# Patient Record
Sex: Male | Born: 1980 | Race: White | Hispanic: No | Marital: Married | Smoking: Never smoker
Health system: Southern US, Community
[De-identification: ages and names within clinical notes are randomized; demographics above are authoritative.]

## PROBLEM LIST (undated history)

## (undated) DIAGNOSIS — Z8619 Personal history of other infectious and parasitic diseases: Secondary | ICD-10-CM

## (undated) DIAGNOSIS — F329 Major depressive disorder, single episode, unspecified: Secondary | ICD-10-CM

## (undated) DIAGNOSIS — K921 Melena: Secondary | ICD-10-CM

## (undated) DIAGNOSIS — I10 Essential (primary) hypertension: Secondary | ICD-10-CM

## (undated) DIAGNOSIS — F32A Depression, unspecified: Secondary | ICD-10-CM

## (undated) HISTORY — DX: Depression, unspecified: F32.A

## (undated) HISTORY — DX: Essential (primary) hypertension: I10

## (undated) HISTORY — DX: Major depressive disorder, single episode, unspecified: F32.9

## (undated) HISTORY — DX: Personal history of other infectious and parasitic diseases: Z86.19

## (undated) HISTORY — PX: APPENDECTOMY: SHX54

## (undated) HISTORY — DX: Melena: K92.1

## (undated) HISTORY — PX: KNEE ARTHROSCOPY: SUR90

---

## 2011-12-07 ENCOUNTER — Encounter: Payer: Self-pay | Admitting: Internal Medicine

## 2011-12-07 ENCOUNTER — Ambulatory Visit (INDEPENDENT_AMBULATORY_CARE_PROVIDER_SITE_OTHER): Payer: BC Managed Care – PPO | Admitting: Internal Medicine

## 2011-12-07 VITALS — BP 102/70 | HR 74 | Temp 98.6°F | Resp 18 | Ht 72.5 in | Wt 222.0 lb

## 2011-12-07 DIAGNOSIS — Z0000 Encounter for general adult medical examination without abnormal findings: Secondary | ICD-10-CM

## 2011-12-07 NOTE — Progress Notes (Signed)
Subjective:    Patient ID: Carlos Hardy, male    DOB: September 18, 1980, 31 y.o.   MRN: [id number]  HPI  31 year old patient who is seen today to establish with our practice. He enjoys excellent health and has no concerns or complaints. Past history is fairly unremarkable does have remote history depression and headaches which have been stable. He has had some borderline high blood pressure readings in the past. He has had bilateral arthroscopic knee surgery. Review of systems exam is otherwise unremarkable did have a single episode of bright red rectal bleeding in the past which has not reoccurred. Social history born in [place] where his mother still resides has been degrees for area since 2006 and graduated [employer] with a doctorate in music and 2011.  No children Family history father died of complications of alcoholism at age 8 mother is 14 with a history of breast cancer. Father had a history of hypertension and depression one twin brother in good health. Paternal grandmother with ovarian cancer maternal grandfather history of diabetes    Review of Systems  Constitutional: Negative for fever, chills, activity change, appetite change and fatigue.  HENT: Negative for hearing loss, ear pain, congestion, rhinorrhea, sneezing, mouth sores, trouble swallowing, neck pain, neck stiffness, dental problem, voice change, sinus pressure and tinnitus.   Eyes: Negative for photophobia, pain, redness and visual disturbance.  Respiratory: Negative for apnea, cough, choking, chest tightness, shortness of breath and wheezing.   Cardiovascular: Negative for chest pain, palpitations and leg swelling.  Gastrointestinal: Negative for nausea, vomiting, abdominal pain, diarrhea, constipation, blood in stool, abdominal distention, anal bleeding and rectal pain.  Genitourinary: Negative for dysuria, urgency, frequency, hematuria, flank pain, decreased urine volume, discharge, penile swelling, scrotal swelling,  difficulty urinating, genital sores and testicular pain.  Musculoskeletal: Negative for myalgias, back pain, joint swelling, arthralgias and gait problem.  Skin: Negative for color change, rash and wound.  Neurological: Negative for dizziness, tremors, seizures, syncope, facial asymmetry, speech difficulty, weakness, light-headedness, numbness and headaches.  Hematological: Negative for adenopathy. Does not bruise/bleed easily.  Psychiatric/Behavioral: Negative for suicidal ideas, hallucinations, behavioral problems, confusion, disturbed wake/sleep cycle, self-injury, dysphoric mood, decreased concentration and agitation. The patient is not nervous/anxious.        Objective:   Physical Exam  Constitutional: He appears well-developed and well-nourished.  HENT:  Head: Normocephalic and atraumatic.  Right Ear: External ear normal.  Left Ear: External ear normal.  Nose: Nose normal.  Mouth/Throat: Oropharynx is clear and moist.  Eyes: Conjunctivae and EOM are normal. Pupils are equal, round, and reactive to light. No scleral icterus.  Neck: Normal range of motion. Neck supple. No JVD present. No thyromegaly present.  Cardiovascular: Regular rhythm, normal heart sounds and intact distal pulses.  Exam reveals no gallop and no friction rub.   No murmur heard. Pulmonary/Chest: Effort normal and breath sounds normal. He exhibits no tenderness.  Abdominal: Soft. Bowel sounds are normal. He exhibits no distension and no mass. There is no tenderness.  Genitourinary: Prostate normal and penis normal.  Musculoskeletal: Normal range of motion. He exhibits no edema and no tenderness.  Lymphadenopathy:    He has no cervical adenopathy.  Neurological: He is alert. He has normal reflexes. No cranial nerve deficit. Coordination normal.  Skin: Skin is warm and dry. No rash noted.  Psychiatric: He has a normal mood and affect. His behavior is normal.          Assessment & Plan:   Unremarkable  clinical exam. Good exercise  capacity. Presently in training for a marathon in October He states that he had a health exam a few years ago that was unremarkable that included a high HDL cholesterol. We'll attempt to obtain these lab records.  Return here when necessary

## 2011-12-07 NOTE — Patient Instructions (Signed)
It is important that you exercise regularly, at least 20 minutes 3 to 4 times per week.  If you develop chest pain or shortness of breath seek  medical attention.  Call or return to clinic prn if these symptoms worsen or fail to improve as anticipated.  

## 2012-04-05 ENCOUNTER — Encounter: Payer: Self-pay | Admitting: Family Medicine

## 2012-04-05 ENCOUNTER — Ambulatory Visit (INDEPENDENT_AMBULATORY_CARE_PROVIDER_SITE_OTHER): Payer: BC Managed Care – PPO | Admitting: Family Medicine

## 2012-04-05 VITALS — BP 110/80 | Temp 97.9°F | Wt 225.0 lb

## 2012-04-05 DIAGNOSIS — J45909 Unspecified asthma, uncomplicated: Secondary | ICD-10-CM

## 2012-04-05 MED ORDER — HYDROCODONE-HOMATROPINE 5-1.5 MG/5ML PO SYRP: ORAL_SOLUTION | ORAL | Status: DC

## 2012-04-05 MED ORDER — PREDNISONE 20 MG PO TABS: ORAL_TABLET | ORAL | Status: DC

## 2012-04-05 NOTE — Progress Notes (Signed)
  Subjective:    Patient ID: Carlos Hardy, male    DOB: [birth year], 31 y.o.   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 However the cough has persisted. He's had no fever chills or sputum production. He's had no previous history of lung disease specifically no asthma. Also he's a nonsmoker. His wife has been well.   Review of Systems    general and pulmonary review of systems otherwise negative Objective:   Physical Exam  Well-developed well-nourished male in no acute distress HEENT negative neck was supple no adenopathy lungs are clear no wheezing      Assessment & Plan:  Reactive airway disease plan prednisone burst and taper return when necessary

## 2012-04-05 NOTE — Patient Instructions (Signed)
Take the prednisone,,,,,,,,,, 2 tabs daily for 5 days or until you see a significant decrease in your cough then taper as outlined  Drink lots of water  Hydromet 1/2-1 teaspoon at bedtime when necessary for cough  Return when necessary

## 2012-07-10 ENCOUNTER — Ambulatory Visit (INDEPENDENT_AMBULATORY_CARE_PROVIDER_SITE_OTHER): Payer: BC Managed Care – PPO | Admitting: Internal Medicine

## 2012-07-10 ENCOUNTER — Encounter: Payer: Self-pay | Admitting: Internal Medicine

## 2012-07-10 VITALS — BP 122/84 | HR 80 | Temp 98.6°F | Resp 20 | Wt 236.0 lb

## 2012-07-10 DIAGNOSIS — K529 Noninfective gastroenteritis and colitis, unspecified: Secondary | ICD-10-CM

## 2012-07-10 DIAGNOSIS — K5289 Other specified noninfective gastroenteritis and colitis: Secondary | ICD-10-CM

## 2012-07-10 MED ORDER — PROMETHAZINE HCL 25 MG PO TABS: 25.0000 mg | ORAL_TABLET | Freq: Three times a day (TID) | ORAL | Status: DC | PRN

## 2012-07-10 NOTE — Progress Notes (Signed)
Subjective:    Patient ID: Carlos Hardy, male    DOB: 1981-04-18, 32 y.o.   MRN: [id number]  HPI 32 year old patient who presents with a 4 to five-day history of episodic crampy abdominal pain diarrhea nausea and vomiting. He has had fever early on illness which has resolved but still has some significant myalgias and fatigue.  He also complains of increasing situational stress and is requesting referral to a counselor  Past Medical History  Diagnosis Date  . Depression   . Hypertension     white coat  . History of chicken pox   . Blood in stool     History   Social History  . Marital Status: Married    Spouse Name: N/A    Number of Children: N/A  . Years of Education: N/A   Occupational History  . Not on file.   Social History Main Topics  . Smoking status: Former Games developer  . Smokeless tobacco: Never Used  . Alcohol Use: Yes  . Drug Use: No  . Sexually Active: Not on file   Other Topics Concern  . Not on file   Social History Narrative  . No narrative on file    Past Surgical History  Procedure Date  . Appendectomy   . Knee arthroscopy     Family History  Problem Relation Age of Onset  . Cancer Mother     breast ca  . Alcohol abuse Father   . Hypertension Father   . Mental illness Father   . Stroke Paternal Aunt   . Arthritis Maternal Grandmother   . Cancer Maternal Grandmother   . Diabetes Maternal Grandfather   . Arthritis Paternal Grandmother     No Known Allergies  Current Outpatient Prescriptions on File Prior to Visit  Medication Sig Dispense Refill  . Multiple Vitamin (MULTIVITAMIN) tablet Take 1 tablet by mouth daily.      . promethazine (PHENERGAN) 25 MG tablet Take 1 tablet (25 mg total) by mouth every 8 (eight) hours as needed for nausea.  20 tablet  0    BP 122/84  Pulse 80  Temp 98.6 F (37 C) (Oral)  Resp 20  Wt 236 lb (107.049 kg)  SpO2 98%       Review of Systems  Constitutional: Positive for fever and fatigue.  Negative for chills and appetite change.  HENT: Negative for hearing loss, ear pain, congestion, sore throat, trouble swallowing, neck stiffness, dental problem, voice change and tinnitus.   Eyes: Negative for pain, discharge and visual disturbance.  Respiratory: Negative for cough, chest tightness, wheezing and stridor.   Cardiovascular: Negative for chest pain, palpitations and leg swelling.  Gastrointestinal: Positive for nausea, vomiting and abdominal pain. Negative for diarrhea, constipation, blood in stool and abdominal distention.  Genitourinary: Negative for urgency, hematuria, flank pain, discharge, difficulty urinating and genital sores.  Musculoskeletal: Positive for myalgias. Negative for back pain, joint swelling, arthralgias and gait problem.  Skin: Negative for rash.  Neurological: Negative for dizziness, syncope, speech difficulty, weakness, numbness and headaches.  Hematological: Negative for adenopathy. Does not bruise/bleed easily.  Psychiatric/Behavioral: Negative for behavioral problems and dysphoric mood. The patient is nervous/anxious.        Objective:   Physical Exam  Constitutional: He is oriented to person, place, and time. He appears well-developed.  HENT:  Head: Normocephalic.  Right Ear: External ear normal.  Left Ear: External ear normal.  Eyes: Conjunctivae normal and EOM are normal.  Neck: Normal range of motion.  Cardiovascular: Normal rate and normal heart sounds.   Pulmonary/Chest: Breath sounds normal.  Abdominal: Bowel sounds are normal.  Musculoskeletal: Normal range of motion. He exhibits no edema and no tenderness.  Neurological: He is alert and oriented to person, place, and time.  Psychiatric: He has a normal mood and affect. His behavior is normal.          Assessment & Plan:    Viral gastroenteritis. Will treat symptomatically with Phenergan we'll advance diet slowly. Situational stress. We'll set up for behavioral health  referral  Return here when necessary

## 2012-07-10 NOTE — Patient Instructions (Signed)
Viral Gastroenteritis Viral gastroenteritis is also known as stomach flu. This condition affects the stomach and intestinal tract. It can cause sudden diarrhea and vomiting. The illness typically lasts 3 to 8 days. Most people develop an immune response that eventually gets rid of the virus. While this natural response develops, the virus can make you quite ill. CAUSES  Many different viruses can cause gastroenteritis, such as rotavirus or noroviruses. You can catch one of these viruses by consuming contaminated food or water. You may also catch a virus by sharing utensils or other personal items with an infected person or by touching a contaminated surface. SYMPTOMS  The most common symptoms are diarrhea and vomiting. These problems can cause a severe loss of body fluids (dehydration) and a body salt (electrolyte) imbalance. Other symptoms may include:  Fever.  Headache.  Fatigue.  Abdominal pain. DIAGNOSIS  Your caregiver can usually diagnose viral gastroenteritis based on your symptoms and a physical exam. A stool sample may also be taken to test for the presence of viruses or other infections. TREATMENT  This illness typically goes away on its own. Treatments are aimed at rehydration. The most serious cases of viral gastroenteritis involve vomiting so severely that you are not able to keep fluids down. In these cases, fluids must be given through an intravenous line (IV). HOME CARE INSTRUCTIONS   Drink enough fluids to keep your urine clear or pale yellow. Drink small amounts of fluids frequently and increase the amounts as tolerated.  Ask your caregiver for specific rehydration instructions.  Avoid:  Foods high in sugar.  Alcohol.  Carbonated drinks.  Tobacco.  Juice.  Caffeine drinks.  Extremely hot or cold fluids.  Fatty, greasy foods.  Too much intake of anything at one time.  Dairy products until 24 to 48 hours after diarrhea stops.  You may consume probiotics.  Probiotics are active cultures of beneficial bacteria. They may lessen the amount and number of diarrheal stools in adults. Probiotics can be found in yogurt with active cultures and in supplements.  Wash your hands well to avoid spreading the virus.  Only take over-the-counter or prescription medicines for pain, discomfort, or fever as directed by your caregiver. Do not give aspirin to children. Antidiarrheal medicines are not recommended.  Ask your caregiver if you should continue to take your regular prescribed and over-the-counter medicines.  Keep all follow-up appointments as directed by your caregiver. SEEK IMMEDIATE MEDICAL CARE IF:   You are unable to keep fluids down.  You do not urinate at least once every 6 to 8 hours.  You develop shortness of breath.  You notice blood in your stool or vomit. This may look like coffee grounds.  You have abdominal pain that increases or is concentrated in one small area (localized).  You have persistent vomiting or diarrhea.  You have a fever.  The patient is a child younger than 3 months, and he or she has a fever.  The patient is a child older than 3 months, and he or she has a fever and persistent symptoms.  The patient is a child older than 3 months, and he or she has a fever and symptoms suddenly get worse.  The patient is a baby, and he or she has no tears when crying. MAKE SURE YOU:   Understand these instructions.  Will watch your condition.  Will get help right away if you are not doing well or get worse. Document Released: 06/07/2005 Document Revised: 08/30/2011 Document Reviewed: 03/24/2011   ExitCare Patient Information 2013 [employer].  

## 2012-07-25 ENCOUNTER — Ambulatory Visit: Payer: BC Managed Care – PPO | Admitting: Licensed Clinical Social Worker

## 2012-08-01 ENCOUNTER — Encounter: Payer: BC Managed Care – PPO | Admitting: Family Medicine

## 2012-08-08 ENCOUNTER — Ambulatory Visit: Payer: BC Managed Care – PPO | Admitting: Licensed Clinical Social Worker

## 2012-08-17 ENCOUNTER — Ambulatory Visit (INDEPENDENT_AMBULATORY_CARE_PROVIDER_SITE_OTHER): Payer: BC Managed Care – PPO | Admitting: Licensed Clinical Social Worker

## 2012-08-29 ENCOUNTER — Ambulatory Visit (INDEPENDENT_AMBULATORY_CARE_PROVIDER_SITE_OTHER): Payer: BC Managed Care – PPO | Admitting: Licensed Clinical Social Worker

## 2012-08-29 DIAGNOSIS — F411 Generalized anxiety disorder: Secondary | ICD-10-CM

## 2012-09-19 ENCOUNTER — Ambulatory Visit (INDEPENDENT_AMBULATORY_CARE_PROVIDER_SITE_OTHER): Payer: BC Managed Care – PPO | Admitting: Licensed Clinical Social Worker

## 2012-09-19 DIAGNOSIS — F411 Generalized anxiety disorder: Secondary | ICD-10-CM

## 2012-10-24 ENCOUNTER — Ambulatory Visit (INDEPENDENT_AMBULATORY_CARE_PROVIDER_SITE_OTHER): Payer: BC Managed Care – PPO | Admitting: Licensed Clinical Social Worker

## 2012-10-24 DIAGNOSIS — F411 Generalized anxiety disorder: Secondary | ICD-10-CM

## 2012-11-28 ENCOUNTER — Ambulatory Visit (INDEPENDENT_AMBULATORY_CARE_PROVIDER_SITE_OTHER): Payer: BC Managed Care – PPO | Admitting: Licensed Clinical Social Worker

## 2012-11-28 DIAGNOSIS — F411 Generalized anxiety disorder: Secondary | ICD-10-CM

## 2012-12-07 ENCOUNTER — Telehealth: Payer: Self-pay | Admitting: Internal Medicine

## 2012-12-07 NOTE — Telephone Encounter (Signed)
Okay to test for blood type

## 2012-12-07 NOTE — Telephone Encounter (Signed)
PT is requesting an order to have his blood type tested. He states that his wives OBGYN is requesting that this be completed within 4 weeks. Please assist.

## 2012-12-08 NOTE — Telephone Encounter (Signed)
Spoke to pt told him we can have his blood type done here but he may get charged for it. Told him if you donate blood at the [facility] they will tell you your blood type for free. Pt verbalized understanding and stated he did contact [facility] and takes 6-8 weeks and he needs to know in 4 weeks due to wife is pregnant. Pt stated he will call insurance to see if covered and get back to [place] if wants to have drawn here. Told him okay.

## 2012-12-26 ENCOUNTER — Ambulatory Visit (INDEPENDENT_AMBULATORY_CARE_PROVIDER_SITE_OTHER): Payer: BC Managed Care – PPO | Admitting: Licensed Clinical Social Worker

## 2012-12-26 DIAGNOSIS — F411 Generalized anxiety disorder: Secondary | ICD-10-CM

## 2013-01-25 ENCOUNTER — Ambulatory Visit (INDEPENDENT_AMBULATORY_CARE_PROVIDER_SITE_OTHER): Payer: BC Managed Care – PPO | Admitting: Licensed Clinical Social Worker

## 2013-01-25 DIAGNOSIS — F411 Generalized anxiety disorder: Secondary | ICD-10-CM

## 2013-01-26 ENCOUNTER — Ambulatory Visit (INDEPENDENT_AMBULATORY_CARE_PROVIDER_SITE_OTHER): Payer: BC Managed Care – PPO | Admitting: *Deleted

## 2013-01-26 DIAGNOSIS — Z23 Encounter for immunization: Secondary | ICD-10-CM

## 2013-01-31 ENCOUNTER — Encounter: Payer: Self-pay | Admitting: Family Medicine

## 2013-01-31 ENCOUNTER — Ambulatory Visit (INDEPENDENT_AMBULATORY_CARE_PROVIDER_SITE_OTHER): Payer: BC Managed Care – PPO | Admitting: Family Medicine

## 2013-01-31 VITALS — BP 120/68 | HR 82 | Temp 98.9°F | Wt 229.0 lb

## 2013-01-31 DIAGNOSIS — R197 Diarrhea, unspecified: Secondary | ICD-10-CM

## 2013-01-31 MED ORDER — ONDANSETRON 8 MG PO TBDP: 8.0000 mg | ORAL_TABLET | Freq: Three times a day (TID) | ORAL | Status: DC | PRN

## 2013-01-31 NOTE — Patient Instructions (Addendum)
Diarrhea Diarrhea is frequent loose and watery bowel movements. It can cause you to feel weak and dehydrated. Dehydration can cause you to become tired and thirsty, have a dry mouth, and have decreased urination that often is dark yellow. Diarrhea is a sign of another problem, most often an infection that will not last long. In most cases, diarrhea typically lasts 2 3 days. However, it can last longer if it is a sign of something more serious. It is important to treat your diarrhea as directed by your caregive to lessen or prevent future episodes of diarrhea. CAUSES  Some common causes include:  Gastrointestinal infections caused by viruses, bacteria, or parasites.  Food poisoning or food allergies.  Certain medicines, such as antibiotics, chemotherapy, and laxatives.  Artificial sweeteners and fructose.  Digestive disorders. HOME CARE INSTRUCTIONS  Ensure adequate fluid intake (hydration): have 1 cup (8 oz) of fluid for each diarrhea episode. Avoid fluids that contain simple sugars or sports drinks, fruit juices, whole milk products, and sodas. Your urine should be clear or pale yellow if you are drinking enough fluids. Hydrate with an oral rehydration solution that you can purchase at pharmacies, retail stores, and online. You can prepare an oral rehydration solution at home by mixing the following ingredients together:    tsp table salt.   tsp baking soda.   tsp salt substitute containing potassium chloride.  1  tablespoons sugar.  1 L (34 oz) of water.  Certain foods and beverages may increase the speed at which food moves through the gastrointestinal (GI) tract. These foods and beverages should be avoided and include:  Caffeinated and alcoholic beverages.  High-fiber foods, such as raw fruits and vegetables, nuts, seeds, and whole grain breads and cereals.  Foods and beverages sweetened with sugar alcohols, such as xylitol, sorbitol, and mannitol.  Some foods may be well  tolerated and may help thicken stool including:  Starchy foods, such as rice, toast, pasta, low-sugar cereal, oatmeal, grits, baked potatoes, crackers, and bagels.  Bananas.  Applesauce.  Add probiotic-rich foods to help increase healthy bacteria in the GI tract, such as yogurt and fermented milk products.  Wash your hands well after each diarrhea episode.  Only take over-the-counter or prescription medicines as directed by your caregiver.  Take a warm bath to relieve any burning or pain from frequent diarrhea episodes. SEEK IMMEDIATE MEDICAL CARE IF:   You are unable to keep fluids down.  You have persistent vomiting.  You have blood in your stool, or your stools are black and tarry.  You do not urinate in 6 8 hours, or there is only a small amount of very dark urine.  You have abdominal pain that increases or localizes.  You have weakness, dizziness, confusion, or lightheadedness.  You have a severe headache.  Your diarrhea gets worse or does not get better.  You have a fever or persistent symptoms for more than 2 3 days.  You have a fever and your symptoms suddenly get worse. MAKE SURE YOU:   Understand these instructions.  Will watch your condition.  Will get help right away if you are not doing well or get worse. Document Released: 05/28/2002 Document Revised: 05/24/2012 Document Reviewed: 02/13/2012 Exit[employer] Patient Information 2014 [employer].  

## 2013-01-31 NOTE — Progress Notes (Signed)
  Subjective:    Patient ID: Carlos Hardy, male    DOB: 1980/09/18, 32 y.o.   MRN: [id number]  HPI Acute illness/visit Onset yesterday body aches, nausea, diarrhea Diarrhea every one hour-watery and non bloody No improvement with Pepto bismol.   ? Fever.  Has some chills.  No vomiting.  No recent travels.  No recent antibiotics.   His wife has not had similar symptoms  Past Medical History  Diagnosis Date  . Depression   . Hypertension     white coat  . History of chicken pox   . Blood in stool    Past Surgical History  Procedure Laterality Date  . Appendectomy    . Knee arthroscopy      reports that he has quit smoking. He has never used smokeless tobacco. He reports that  drinks alcohol. He reports that he does not use illicit drugs. family history includes Alcohol abuse in his father; Arthritis in his maternal grandmother and paternal grandmother; Cancer in his maternal grandmother and mother; Diabetes in his maternal grandfather; Hypertension in his father; Mental illness in his father; Stroke in his paternal aunt. No Known Allergies    Review of Systems  Constitutional: Positive for fever, chills and fatigue.  HENT: Negative for congestion.   Respiratory: Negative for cough and shortness of breath.   Gastrointestinal: Positive for nausea, abdominal pain and diarrhea. Negative for vomiting and blood in stool.  Neurological: Negative for dizziness.       Objective:   Physical Exam  Constitutional: He appears well-developed and well-nourished.  HENT:  Mouth/Throat: Oropharynx is clear and moist.  Neck: Neck supple.  Cardiovascular: Normal rate and regular rhythm.   Pulmonary/Chest: Effort normal and breath sounds normal. No respiratory distress. He has no wheezes. He has no rales.  Abdominal: Soft. Bowel sounds are normal. He exhibits no distension and no mass. There is no rebound and no guarding.  Minimal diffuse tenderness lower abdomen right and left side. Not  localizing. No peritoneal signs          Assessment & Plan:  Acute diarrhea illness. Suspect viral. Zofran 8 mg every 8 hours as needed for nausea. Try over-the-counter Imodium. Dietary factors discussed. Work note written for today. Touch base if symptoms worsen or persist

## 2013-03-06 ENCOUNTER — Ambulatory Visit: Payer: BC Managed Care – PPO | Admitting: Licensed Clinical Social Worker

## 2013-04-12 ENCOUNTER — Telehealth: Payer: Self-pay

## 2013-04-12 NOTE — Telephone Encounter (Signed)
Medication List and allergies: updated and reviewed  90 day supply/mail order: na Local prescriptions: [facility] [address]  Immunizations due: flu vaccine (will advise at visit)  A/P:   No changes to FH, SH Last CPE 11/2011--Dr Kwiatkowski-unremarkable  To Discuss with Provider: [facility]

## 2013-04-16 ENCOUNTER — Encounter: Payer: Self-pay | Admitting: Family Medicine

## 2013-04-16 ENCOUNTER — Ambulatory Visit (INDEPENDENT_AMBULATORY_CARE_PROVIDER_SITE_OTHER): Payer: BC Managed Care – PPO | Admitting: Family Medicine

## 2013-04-16 VITALS — BP 118/70 | HR 63 | Temp 98.1°F | Resp 16 | Ht 72.5 in | Wt 232.0 lb

## 2013-04-16 DIAGNOSIS — Z0000 Encounter for general adult medical examination without abnormal findings: Secondary | ICD-10-CM

## 2013-04-16 NOTE — Assessment & Plan Note (Signed)
Pt's PE WNL.  Check labs.  Anticipatory guidance provided.  

## 2013-04-16 NOTE — Progress Notes (Signed)
  Subjective:    Patient ID: Sivan Quast, male    DOB: 1981/04/14, 32 y.o.   MRN: [id number]  HPI CPE- no concerns.   Review of Systems Patient reports no vision/hearing changes, anorexia, fever ,adenopathy, persistant/recurrent hoarseness, swallowing issues, chest pain, palpitations, edema, persistant/recurrent cough, hemoptysis, dyspnea (rest,exertional, paroxysmal nocturnal), gastrointestinal  bleeding (melena, rectal bleeding), abdominal pain, excessive heart burn, GU symptoms (dysuria, hematuria, voiding/incontinence issues) syncope, focal weakness, memory loss, numbness & tingling, skin/hair/nail changes, depression, anxiety, abnormal bruising/bleeding, musculoskeletal symptoms/signs.     Objective:   Physical Exam BP 118/70  Pulse 63  Temp(Src) 98.1 F (36.7 C) (Oral)  Resp 16  Ht 6\' 5"  (1.956 m)  Wt 232 lb (105.235 kg)  BMI 27.51 kg/m2  SpO2 98%  General Appearance:    Alert, cooperative, no distress, appears stated age  Head:    Normocephalic, without obvious abnormality, atraumatic  Eyes:    PERRL, conjunctiva/corneas clear, EOM's intact, fundi    benign, both eyes       Ears:    Normal TM's and external ear canals, both ears  Nose:   Nares normal, septum midline, mucosa normal, no drainage   or sinus tenderness  Throat:   Lips, mucosa, and tongue normal; teeth and gums normal  Neck:   Supple, symmetrical, trachea midline, no adenopathy;       thyroid:  No enlargement/tenderness/nodules  Back:     Symmetric, no curvature, ROM normal, no CVA tenderness  Lungs:     Clear to auscultation bilaterally, respirations unlabored  Chest wall:    No tenderness or deformity  Heart:    Regular rate and rhythm, S1 and S2 normal, no murmur, rub   or gallop  Abdomen:     Soft, non-tender, bowel sounds active all four quadrants,    no masses, no organomegaly  Genitalia:    Normal male without lesion, discharge or tenderness  Rectal:      Extremities:   Extremities normal,  atraumatic, no cyanosis or edema  Pulses:   2+ and symmetric all extremities  Skin:   Skin color, texture, turgor normal, no rashes or lesions  Lymph nodes:   Cervical, supraclavicular, and axillary nodes normal  Neurologic:   CNII-XII intact. Normal strength, sensation and reflexes      throughout          Assessment & Plan:

## 2013-04-16 NOTE — Patient Instructions (Signed)
Follow up in 1 year or as needed We'll notify you of your lab results and make any changes if needed Keep up the good work!  You look great! Call with any questions or concerns Welcome!  We're glad to have you! 

## 2013-04-17 LAB — CBC WITH DIFFERENTIAL/PLATELET
Basophils Absolute: 0 10*3/uL (ref 0.0–0.1)
Basophils Relative: 0.3 % (ref 0.0–3.0)
Eosinophils Absolute: 0.2 10*3/uL (ref 0.0–0.7)
HCT: 42.5 % (ref 39.0–52.0)
Hemoglobin: 14.5 g/dL (ref 13.0–17.0)
Lymphocytes Relative: 27.6 % (ref 12.0–46.0)
Lymphs Abs: 2.8 10*3/uL (ref 0.7–4.0)
MCHC: 34.2 g/dL (ref 30.0–36.0)
MCV: 85.9 fl (ref 78.0–100.0)
Neutro Abs: 6.4 10*3/uL (ref 1.4–7.7)
Neutrophils Relative %: 62.4 % (ref 43.0–77.0)
RBC: 4.95 Mil/uL (ref 4.22–5.81)
RDW: 13.2 % (ref 11.5–14.6)

## 2013-04-17 LAB — BASIC METABOLIC PANEL
BUN: 13 mg/dL (ref 6–23)
CO2: 23 mEq/L (ref 19–32)
Calcium: 9.5 mg/dL (ref 8.4–10.5)
Chloride: 103 mEq/L (ref 96–112)
Creatinine, Ser: 0.8 mg/dL (ref 0.4–1.5)
GFR: 126.14 mL/min (ref >60.00)
Potassium: 3.9 mEq/L (ref 3.5–5.1)

## 2013-04-17 LAB — HEPATIC FUNCTION PANEL
ALT: 26 U/L (ref 0–53)
Alkaline Phosphatase: 43 U/L (ref 39–117)
Total Bilirubin: 0.8 mg/dL (ref 0.3–1.2)
Total Protein: 7.8 g/dL (ref 6.0–8.3)

## 2013-04-17 LAB — TSH: TSH: 0.29 u[IU]/mL — ABNORMAL LOW (ref 0.35–5.50)

## 2013-04-17 LAB — LIPID PANEL
Cholesterol: 196 mg/dL (ref 0–200)
Triglycerides: 57 mg/dL (ref 0.0–149.0)

## 2013-04-20 ENCOUNTER — Encounter: Payer: Self-pay | Admitting: Family Medicine

## 2013-11-16 ENCOUNTER — Ambulatory Visit (INDEPENDENT_AMBULATORY_CARE_PROVIDER_SITE_OTHER): Payer: BC Managed Care – PPO | Admitting: Physician Assistant

## 2013-11-16 ENCOUNTER — Encounter: Payer: Self-pay | Admitting: Physician Assistant

## 2013-11-16 VITALS — BP 124/88 | HR 76 | Temp 98.8°F | Resp 16 | Ht 72.5 in | Wt 222.5 lb

## 2013-11-16 DIAGNOSIS — H109 Unspecified conjunctivitis: Secondary | ICD-10-CM

## 2013-11-16 DIAGNOSIS — J329 Chronic sinusitis, unspecified: Secondary | ICD-10-CM

## 2013-11-16 MED ORDER — AMOXICILLIN 875 MG PO TABS: 875.0000 mg | ORAL_TABLET | Freq: Two times a day (BID) | ORAL | Status: DC

## 2013-11-16 MED ORDER — ERYTHROMYCIN 5 MG/GM OP OINT: 1.0000 "application " | TOPICAL_OINTMENT | Freq: Three times a day (TID) | OPHTHALMIC | Status: AC

## 2013-11-16 NOTE — Progress Notes (Signed)
Pre visit review using our clinic review tool, if applicable. No additional management support is needed unless otherwise documented below in the visit note/SLS  

## 2013-11-16 NOTE — Patient Instructions (Signed)
Please take antibiotic as directed.  Increase fluid intake.  Use Saline nasal spray.  Take a daily multivitamin. Use Delsym for cough.  Place a humidifier in the bedroom.  Please call or return clinic if symptoms are not improving.  For conjunctivitis -- apply antibiotic ointment as directed for 7 days.  Warm compresses to eyes.  Keep hands washed to avoid reinfection.  Sinusitis Sinusitis is redness, soreness, and swelling (inflammation) of the paranasal sinuses. Paranasal sinuses are air pockets within the bones of your face (beneath the eyes, the middle of the forehead, or above the eyes). In healthy paranasal sinuses, mucus is able to drain out, and air is able to circulate through them by way of your nose. However, when your paranasal sinuses are inflamed, mucus and air can become trapped. This can allow bacteria and other germs to grow and cause infection. Sinusitis can develop quickly and last only a short time (acute) or continue over a long period (chronic). Sinusitis that lasts for more than 12 weeks is considered chronic.  CAUSES  Causes of sinusitis include:  Allergies.  Structural abnormalities, such as displacement of the cartilage that separates your nostrils (deviated septum), which can decrease the air flow through your nose and sinuses and affect sinus drainage.  Functional abnormalities, such as when the small hairs (cilia) that line your sinuses and help remove mucus do not work properly or are not present. SYMPTOMS  Symptoms of acute and chronic sinusitis are the same. The primary symptoms are pain and pressure around the affected sinuses. Other symptoms include:  Upper toothache.  Earache.  Headache.  Bad breath.  Decreased sense of smell and taste.  A cough, which worsens when you are lying flat.  Fatigue.  Fever.  Thick drainage from your nose, which often is green and may contain pus (purulent).  Swelling and warmth over the affected sinuses. DIAGNOSIS    Your caregiver will perform a physical exam. During the exam, your caregiver may:  Look in your nose for signs of abnormal growths in your nostrils (nasal polyps).  Tap over the affected sinus to check for signs of infection.  View the inside of your sinuses (endoscopy) with a special imaging device with a light attached (endoscope), which is inserted into your sinuses. If your caregiver suspects that you have chronic sinusitis, one or more of the following tests may be recommended:  Allergy tests.  Nasal culture A sample of mucus is taken from your nose and sent to a lab and screened for bacteria.  Nasal cytology A sample of mucus is taken from your nose and examined by your caregiver to determine if your sinusitis is related to an allergy. TREATMENT  Most cases of acute sinusitis are related to a viral infection and will resolve on their own within 10 days. Sometimes medicines are prescribed to help relieve symptoms (pain medicine, decongestants, nasal steroid sprays, or saline sprays).  However, for sinusitis related to a bacterial infection, your caregiver will prescribe antibiotic medicines. These are medicines that will help kill the bacteria causing the infection.  Rarely, sinusitis is caused by a fungal infection. In theses cases, your caregiver will prescribe antifungal medicine. For some cases of chronic sinusitis, surgery is needed. Generally, these are cases in which sinusitis recurs more than 3 times per year, despite other treatments. HOME CARE INSTRUCTIONS   Drink plenty of water. Water helps thin the mucus so your sinuses can drain more easily.  Use a humidifier.  Inhale steam 3 to  4 times a day (for example, sit in the bathroom with the shower running).  Apply a warm, moist washcloth to your face 3 to 4 times a day, or as directed by your caregiver.  Use saline nasal sprays to help moisten and clean your sinuses.  Take over-the-counter or prescription medicines for  pain, discomfort, or fever only as directed by your caregiver. SEEK IMMEDIATE MEDICAL CARE IF:  You have increasing pain or severe headaches.  You have nausea, vomiting, or drowsiness.  You have swelling around your face.  You have vision problems.  You have a stiff neck.  You have difficulty breathing. MAKE SURE YOU:   Understand these instructions.  Will watch your condition.  Will get help right away if you are not doing well or get worse. Document Released: 06/07/2005 Document Revised: 08/30/2011 Document Reviewed: 06/22/2011 [facility] Patient Information 2014 [place], [place].

## 2013-11-18 DIAGNOSIS — H109 Unspecified conjunctivitis: Secondary | ICD-10-CM | POA: Insufficient documentation

## 2013-11-18 DIAGNOSIS — J329 Chronic sinusitis, unspecified: Secondary | ICD-10-CM | POA: Insufficient documentation

## 2013-11-18 NOTE — Assessment & Plan Note (Signed)
Rx erythromycin ophthalmic ointment. Applied to both eyes 3 times a day for 7 days. Warm compresses to eyes. Discussed hygiene measures to prevent the spread of infection and to prevent recurrence. Followup if symptoms are not improving.

## 2013-11-18 NOTE — Assessment & Plan Note (Signed)
Rx amoxicillin. Increase fluid intake. Rest. Saline nasal spray. Mucinex for cough/congestion. Place humidifier in bedroom.

## 2013-11-18 NOTE — Progress Notes (Signed)
Patient presents to clinic today c/o worsening sinus pressure, sinus pain, postnasal drip, and cough. Patient also endorses ear pain. Patient states cough was initially nonproductive, but over the past day has become productive of a green sputum. Patient also complains of waking up today with redness in both of his eyes. States his eyes for "crusted" upon awakening, and he had to clean them before being able to open them. Denies fever, chills, shortness of breath or wheezing. Denies recent travel. Notes his son has had conjunctivitis.  Past Medical History  Diagnosis Date  . Depression   . Hypertension     white coat  . History of chicken pox   . Blood in stool     Current Outpatient Prescriptions on File Prior to Visit  Medication Sig Dispense Refill  . Multiple Vitamin (MULTIVITAMIN) tablet Take 1 tablet by mouth daily.       No current facility-administered medications on file prior to visit.    No Known Allergies  Family History  Problem Relation Age of Onset  . Cancer Mother     breast ca  . Alcohol abuse Father   . Hypertension Father   . Mental illness Father   . Stroke Paternal Aunt   . Arthritis Maternal Grandmother   . Cancer Maternal Grandmother   . Diabetes Maternal Grandfather   . Arthritis Paternal Grandmother     History   Social History  . Marital Status: Married    Spouse Name: N/A    Number of Children: N/A  . Years of Education: N/A   Social History Main Topics  . Smoking status: Never Smoker   . Smokeless tobacco: Never Used  . Alcohol Use: Yes  . Drug Use: No  . Sexual Activity: Yes   Other Topics Concern  . None   Social History Narrative  . None   Review of Systems - See HPI.  All other ROS are negative.  BP 124/88  Pulse 76  Temp(Src) 98.8 F (37.1 C) (Oral)  Resp 16  Ht 6' 0.5" (1.842 m)  Wt 222 lb 8 oz (100.925 kg)  BMI 29.75 kg/m2  SpO2 98%  Physical Exam  Vitals reviewed. Constitutional: He is oriented to person, place,  and time and well-developed, well-nourished, and in no distress.  HENT:  Head: Normocephalic and atraumatic.  Right Ear: External ear normal.  Left Ear: External ear normal.  Nose: Nose normal.  Mouth/Throat: Oropharynx is clear and moist. No oropharyngeal exudate.  Tympanic membranes within normal limits bilaterally. Positive tenderness to percussion of sinuses noted.  Eyes: EOM are normal. Pupils are equal, round, and reactive to light. Lids are everted and swept, no foreign bodies found. Right eye exhibits discharge. Right eye exhibits no hordeolum. No foreign body present in the right eye. Left eye exhibits discharge. Left eye exhibits no hordeolum. No foreign body present in the left eye. Right conjunctiva is injected. Right conjunctiva has no hemorrhage. Left conjunctiva is injected. Left conjunctiva has no hemorrhage. No scleral icterus.  Cardiovascular: Normal rate, regular rhythm, normal heart sounds and intact distal pulses.   Pulmonary/Chest: Effort normal and breath sounds normal. No respiratory distress. He has no wheezes. He has no rales. He exhibits no tenderness.  Neurological: He is alert and oriented to person, place, and time.  Skin: Skin is warm and dry. No rash noted.  Psychiatric: Affect normal.   Assessment/Plan: Sinusitis Rx amoxicillin. Increase fluid intake. Rest. Saline nasal spray. Mucinex for cough/congestion. Place humidifier in bedroom.  Conjunctivitis Rx erythromycin ophthalmic ointment. Applied to both eyes 3 times a day for 7 days. Warm compresses to eyes. Discussed hygiene measures to prevent the spread of infection and to prevent recurrence. Followup if symptoms are not improving.

## 2013-12-03 ENCOUNTER — Encounter: Payer: Self-pay | Admitting: Family Medicine

## 2013-12-03 ENCOUNTER — Ambulatory Visit (INDEPENDENT_AMBULATORY_CARE_PROVIDER_SITE_OTHER): Payer: BC Managed Care – PPO | Admitting: Family Medicine

## 2013-12-03 VITALS — BP 136/93 | Ht 73.0 in | Wt 220.0 lb

## 2013-12-03 DIAGNOSIS — M25519 Pain in unspecified shoulder: Secondary | ICD-10-CM

## 2013-12-04 NOTE — Progress Notes (Signed)
Patient ID: Carlos Hardy, male   DOB: [birth year], 33 y.o.   MRN: [id number]  Carlos Hardy - 33 y.o. male MRN [id number]  Date of birth: [birth year]    SUBJECTIVE:     Right shoulder pain for several weeks. 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 Pain is mild--2-4 / 10 rarely takes anything for it. Right hand dominant.  ROS:     No numbness or tingling, no joint swelling, no fever.  PERTINENT  PMH / PSH FH / / SH:  Past Medical, Surgical, Social, and Family History Reviewed & Updated per EMR.  Pertinent Historical Findings include:  No prior hx rigt shoulder injury or surgery  OBJECTIVE: BP 136/93  Ht 6\' 1"  (1.854 m)  Wt 220 lb (99.791 kg)  BMI 29.03 kg/m2  Physical Exam:  Vital signs are reviewed. GEN: WD WN NAD Shoulder: B shoulders are symmetrical. Right shoulder: FROM and normal strength in all planes of rotator cuff. Some pain with supraspinatus tsting but no weakness. Neurovascularlyintact distally.  ASSESSMENT & PLAN:  See problem based charting & AVS for pt instructions. Rotator cuff syndrome (subacromial bursitis). No indication of RC cuff tear. We discussed options and will do HEP, f/u 4 weeks. Discussed CSI and he did notwant to pursue hat atthis time.

## 2013-12-26 ENCOUNTER — Ambulatory Visit (INDEPENDENT_AMBULATORY_CARE_PROVIDER_SITE_OTHER): Payer: BC Managed Care – PPO | Admitting: Sports Medicine

## 2013-12-26 ENCOUNTER — Encounter: Payer: Self-pay | Admitting: Sports Medicine

## 2013-12-26 VITALS — BP 131/89 | Ht 73.0 in | Wt 220.0 lb

## 2013-12-26 DIAGNOSIS — M25519 Pain in unspecified shoulder: Secondary | ICD-10-CM

## 2013-12-26 DIAGNOSIS — M25511 Pain in right shoulder: Secondary | ICD-10-CM

## 2013-12-26 MED ORDER — MELOXICAM 15 MG PO TABS: ORAL_TABLET | ORAL | Status: DC

## 2013-12-27 NOTE — Progress Notes (Signed)
   Subjective:    Patient ID: Carlos Hardy, male    DOB: Apr 08, 1981, 33 y.o.   MRN: [id number]  HPI chief complaint: Right shoulder pain  33 year old right-hand-dominant male comes in today complaining of several months of superior and anterior shoulder pain. No trauma that he can recall but a gradual onset of pain that is now constantly present. He describes it as aching in quality and it is worsened with certain shoulder motions such as reaching in the back seat of his automobile. No swelling. No neck pain. He does note some occasional numbness and tingling along the ulnar nerve distribution of his right arm beginning at the elbow and radiating into the hand but this is specific to certain movements that he does during his workouts. He denies deep-seated shoulder pain. He has not tried any medications. No prior shoulder surgery. He continues to work out despite his discomfort. He has not noticed any weakness. No pain in the left shoulder. He was seen in our office about a month ago and given a Jobe home exercise program which has not resulted in any improvement. In fact, he states the exercises make his shoulder pain worse. Past medical history review He takes no chronic medication No known drug allergies    Review of Systems    as above Objective:   Physical Exam Well-developed, well-nourished. No acute distress. Awake alert and oriented x3. Vital signs are reviewed  Right shoulder: Patient has full painless range of motion. There is tenderness to palpation directly over the a.c. joint with a positive cross arm adduction test. No deformity. No tenderness over the bicipital groove. Rotator cuff is 5/5. Negative empty can, negative Hawkins. Negative Neer's. Negative O'Briens. Negative clunk. No noticeable scapular dyskinesis or winging.  Right elbow: Full range of motion. No effusion. Mild Tinel's over the ulnar groove. No atrophy. Good grip strength. Good radial ulnar pulses.         Assessment & Plan:  Right shoulder pain likely secondary to a.c. joint inflammation Intermittent ulnar neuropathy, right arm  We will start with getting plain x-rays of his right shoulder. Patient is instructed not to do any sort of activity or exercise that loads the shoulder joint such as pushups or bench press. He can continue with all her other activity that does not hurt. I've also instructed him to avoid that specific exercise which causes neuropathic discomfort along the ulnar nerve distribution of his right arm. We will place him on Mobic 15 mg daily for 10 days and he'll followup in 3 weeks. I did discuss the possibility of a cortisone injection if symptoms warrant but the patient is not interested in that at this time.

## 2013-12-28 ENCOUNTER — Ambulatory Visit
Admission: RE | Admit: 2013-12-28 | Discharge: 2013-12-28 | Disposition: A | Discharge status: Home / Self Care | Payer: BC Managed Care – PPO | Source: Ambulatory Visit | Attending: Sports Medicine | Admitting: Sports Medicine

## 2013-12-28 DIAGNOSIS — M25511 Pain in right shoulder: Secondary | ICD-10-CM

## 2014-01-09 ENCOUNTER — Encounter: Payer: Self-pay | Admitting: Family Medicine

## 2014-01-21 ENCOUNTER — Encounter: Payer: Self-pay | Admitting: Sports Medicine

## 2014-01-21 ENCOUNTER — Ambulatory Visit (INDEPENDENT_AMBULATORY_CARE_PROVIDER_SITE_OTHER): Payer: BC Managed Care – PPO | Admitting: Sports Medicine

## 2014-01-21 VITALS — BP 158/96 | HR 77 | Ht 73.0 in | Wt 220.0 lb

## 2014-01-21 DIAGNOSIS — M25519 Pain in unspecified shoulder: Secondary | ICD-10-CM

## 2014-01-21 DIAGNOSIS — M25511 Pain in right shoulder: Secondary | ICD-10-CM

## 2014-01-21 NOTE — Progress Notes (Signed)
   Subjective:    Patient ID: Carlos Hardy, male    DOB: July 17, 1980, 33 y.o.   MRN: [id number]  HPI Patient comes in today for followup on his right shoulder pain. Overall, his pain has improved but he has not been doing any weight lifting or resistance training. Still gets discomfort with certain activities such as reaching in his back seat. Pain is a little more diffuse throughout the shoulder today than it was on his prior exam. He only meloxicam for 2 days. X-rays of his right shoulder done recently were unremarkable. Specifically, I did not see any significant a.c. joint arthropathy.    Review of Systems     Objective:   Physical Exam Well-developed, well-nourished. No acute distress. Awake alert and oriented x3. Vital signs are reviewed  Right shoulder: Full active and passive range of motion with a positive painful ARC. Slight tenderness over the a.c. joint but a negative cross arm adduction test today. Positive empty can, positive Hawkins. Rotator cuff strength is 5/5 but reproducible pain with resisted supraspinatus. Negative O'Brien. Neurovascularly intact distally.  X-rays are as above      Assessment & Plan:  Right shoulder pain likely secondary to rotator cuff tendinopathy/subacromial bursitis  I've asked the patient to resume his Mobic, this time taking it for a full 7 days. I also want him to work with physical therapy Oscar La(John O' Halloran). Patient will followup with me in 4 weeks. We discussed the merits of a subacromial cortisone injection if symptoms persist.

## 2014-02-18 ENCOUNTER — Ambulatory Visit: Payer: BC Managed Care – PPO | Admitting: Sports Medicine

## 2014-02-21 ENCOUNTER — Telehealth: Payer: Self-pay | Admitting: Physician Assistant

## 2014-02-21 ENCOUNTER — Ambulatory Visit (INDEPENDENT_AMBULATORY_CARE_PROVIDER_SITE_OTHER): Payer: BC Managed Care – PPO

## 2014-02-21 DIAGNOSIS — Z23 Encounter for immunization: Secondary | ICD-10-CM

## 2014-02-21 NOTE — Progress Notes (Signed)
Vaccine given in pt's left deltoid.  No signs of reaction prior to leaving clinic.  Hep B information sheet reviewed and given to patient.  Pt was encouraged to call with questions or concerns regarding vaccine.  Pt stated understanding and agreed.

## 2014-02-21 NOTE — Telephone Encounter (Signed)
Pt notified he needs a PPD (scheduled for next Tuesday) and Hep b series started today.

## 2014-02-21 NOTE — Telephone Encounter (Signed)
Caller name: Rondo  Relation to pt: self  Call back number: [phone]   Reason for call:  pt requesting immunization summary. i see it in the system was not sure if I can print. Pt would like to come p/u in office today

## 2014-02-21 NOTE — Telephone Encounter (Signed)
This patient does not belong to me. Will send this note to the CMA for the patient's PCP to print out [facility] data.

## 2014-02-22 ENCOUNTER — Ambulatory Visit: Payer: BC Managed Care – PPO

## 2014-02-26 ENCOUNTER — Ambulatory Visit (INDEPENDENT_AMBULATORY_CARE_PROVIDER_SITE_OTHER): Payer: BC Managed Care – PPO

## 2014-02-26 DIAGNOSIS — Z111 Encounter for screening for respiratory tuberculosis: Secondary | ICD-10-CM

## 2014-02-28 ENCOUNTER — Encounter: Payer: Self-pay | Admitting: General Practice

## 2014-02-28 ENCOUNTER — Ambulatory Visit (INDEPENDENT_AMBULATORY_CARE_PROVIDER_SITE_OTHER): Payer: BC Managed Care – PPO | Admitting: Family Medicine

## 2014-02-28 ENCOUNTER — Encounter: Payer: Self-pay | Admitting: Family Medicine

## 2014-02-28 VITALS — BP 130/82 | HR 87 | Temp 98.6°F | Resp 16 | Wt 222.2 lb

## 2014-02-28 DIAGNOSIS — J029 Acute pharyngitis, unspecified: Secondary | ICD-10-CM

## 2014-02-28 DIAGNOSIS — J020 Streptococcal pharyngitis: Secondary | ICD-10-CM | POA: Insufficient documentation

## 2014-02-28 DIAGNOSIS — R6889 Other general symptoms and signs: Secondary | ICD-10-CM

## 2014-02-28 LAB — POCT RAPID STREP A (OFFICE): Rapid Strep A Screen: POSITIVE — AB

## 2014-02-28 LAB — POCT INFLUENZA A/B
INFLUENZA A, POC: NEGATIVE
Influenza B, POC: NEGATIVE

## 2014-02-28 LAB — TB SKIN TEST
INDURATION: NEGATIVE mm
TB Skin Test: NEGATIVE

## 2014-02-28 MED ORDER — AMOXICILLIN 875 MG PO TABS: 875.0000 mg | ORAL_TABLET | Freq: Two times a day (BID) | ORAL | Status: AC

## 2014-02-28 NOTE — Assessment & Plan Note (Signed)
New.  Pt's sxs and PE consistent w/ strep.  Negative for flu.  Start abx.  Work note provided.  Reviewed supportive care and red flags that should prompt return.  Pt expressed understanding and is in agreement w/ plan.

## 2014-02-28 NOTE — Patient Instructions (Signed)
This is strep throat You should remain out of work until Monday 9/14 Start the Amoxicillin twice daily as directed Drink plenty of fluids Alternate tylenol/ibuprofen for pain/fever REST! Call with any questions or concerns Hang in there!!!

## 2014-02-28 NOTE — Progress Notes (Signed)
Pre visit review using our clinic review tool, if applicable. No additional management support is needed unless otherwise documented below in the visit note. 

## 2014-02-28 NOTE — Progress Notes (Signed)
   Subjective:    Patient ID: Carlos Hardy, male    DOB: Jan 13, 1981, 33 y.o.   MRN: [id number]  HPI URI- sxs started last night and worsened this AM.  Tm 102.8.  + sick contacts- wife and daughter.  No ear pain.  + HA, body aches, sore throat.  No N/V/D.  No coughing.  + facial pain.   Review of Systems For ROS see HPI     Objective:   Physical Exam  Vitals reviewed. Constitutional: He appears well-developed and well-nourished. No distress.  HENT:  Head: Normocephalic and atraumatic.  No TTP over sinuses TMs WNL bilaterally Throat erythematous w/ tonsillar exudates  Neck: Normal range of motion. Neck supple.  Cardiovascular: Normal rate, regular rhythm and normal heart sounds.   Pulmonary/Chest: Effort normal and breath sounds normal. No respiratory distress. He has no wheezes. He has no rales.  Lymphadenopathy:    He has cervical adenopathy.          Assessment & Plan:

## 2014-03-04 ENCOUNTER — Ambulatory Visit: Payer: BC Managed Care – PPO | Admitting: Sports Medicine

## 2014-04-03 ENCOUNTER — Encounter: Payer: Self-pay | Admitting: Sports Medicine

## 2014-04-03 ENCOUNTER — Ambulatory Visit (INDEPENDENT_AMBULATORY_CARE_PROVIDER_SITE_OTHER): Payer: BC Managed Care – PPO | Admitting: Sports Medicine

## 2014-04-03 VITALS — BP 135/101 | Ht 73.0 in | Wt 215.0 lb

## 2014-04-03 DIAGNOSIS — M25511 Pain in right shoulder: Secondary | ICD-10-CM | POA: Diagnosis not present

## 2014-04-04 NOTE — Progress Notes (Signed)
   Subjective:    Patient ID: Filomena JunglingChristian Erbes, male    DOB: Jan 01, 1981, 33 y.o.   MRN: [id number]  HPI Patient comes in today for followup on right shoulder pain. Still struggling with pain, particularly in the anterior aspect of his shoulder. His symptoms have now been present for about 10 months. He has tried physical therapy and prescription strength anti-inflammatories but despite this his symptoms persist. His pain is aching in quality. He does get some occasional catching and popping in the shoulder as well. Also some nighttime pain. He has not noticed any weakness. Denies any numbness or tingling. X-rays of his right shoulder done in July of this year were unremarkable.    Review of Systems     Objective:   Physical Exam Well-developed, well-nourished. No acute distress. Awake alert and oriented x3. Vital signs reviewed  Right shoulder: Full range of motion. No tenderness along the clavicle nor over the a.c. joint. Slight tenderness at the bicipital groove. Rotator cuff strength is 5/5. Negative empty can, negative Hawkins. Positive O'Brien's. Positive clunk. Negative apprehension. 1-2+ laxity with anterior to posterior humeral head translation. This is equal to the uninvolved left shoulder. Good grip strength. Neurovascularly intact distally.       Assessment & Plan:  Persistent right shoulder pain worrisome for labral tear  At this point, given his persistent symptoms and failure to improve with physical therapy and oral anti-inflammatories, I recommended proceeding with an MRI arthrogram specifically to rule out a labral tear which may need arthroscopic intervention. I will call the patient after I reviewed that study at which point we will delineate further treatment.

## 2014-04-17 ENCOUNTER — Other Ambulatory Visit: Payer: BC Managed Care – PPO

## 2014-06-17 ENCOUNTER — Ambulatory Visit
Admission: RE | Admit: 2014-06-17 | Discharge: 2014-06-17 | Disposition: A | Discharge status: Home / Self Care | Payer: BC Managed Care – PPO | Source: Ambulatory Visit | Attending: Sports Medicine | Admitting: Sports Medicine

## 2014-06-17 DIAGNOSIS — M25511 Pain in right shoulder: Secondary | ICD-10-CM

## 2014-06-17 MED ORDER — IOHEXOL 180 MG/ML  SOLN
20.0000 mL | Freq: Once | INTRAMUSCULAR | Status: AC | PRN
  Administered 2014-06-17: 20 mL via INTRA_ARTICULAR

## 2014-06-20 ENCOUNTER — Telehealth: Payer: Self-pay | Admitting: Sports Medicine

## 2014-06-20 NOTE — Telephone Encounter (Signed)
I spoke with the patient on the phone today after reviewing the MRI arthrogram of his right shoulder. No evidence of a labral tear. Biceps tendon is unremarkable. He has some mild rotator cuff tendinopathy/tendinosis but no obvious tearing. I've recommended that the patient return to the office for a subacromial cortisone injection. I think he would benefit from a return visit to physical therapy Carlos Hardy(Carlos Hardy). He will follow-up with me in the office in one to 2 weeks to discuss this further.

## 2014-07-08 ENCOUNTER — Ambulatory Visit (INDEPENDENT_AMBULATORY_CARE_PROVIDER_SITE_OTHER): Payer: BLUE CROSS/BLUE SHIELD | Admitting: Sports Medicine

## 2014-07-08 ENCOUNTER — Encounter: Payer: Self-pay | Admitting: Sports Medicine

## 2014-07-08 VITALS — BP 135/93 | HR 69 | Ht 73.0 in | Wt 217.0 lb

## 2014-07-08 DIAGNOSIS — M25511 Pain in right shoulder: Secondary | ICD-10-CM

## 2014-07-08 MED ORDER — METHYLPREDNISOLONE ACETATE 40 MG/ML IJ SUSP
40.0000 mg | Freq: Once | INTRAMUSCULAR | Status: AC
  Administered 2014-07-08: 40 mg via INTRA_ARTICULAR

## 2014-07-08 NOTE — Progress Notes (Signed)
Patient ID: Filomena JunglingChristian Sanmiguel, male   DOB: 01-07-1981, 34 y.o.   MRN: [id number] Patient comes in today at my request for a subacromial cortisone injection into his right shoulder. A recent MRI arthrogram of his right shoulder showed some mild rotator cuff tendinopathy/tendinosis but no tearing. No labral tear either. I will inject his subacromial space with cortisone today and then I have instructed him to return to physical therapy with Ellamae SiaJohn O'Halloran. I want the patient to call me in 6 weeks for a progress report. His symptoms are chronic enough (over 1 year) that if he continues to struggle despite today's injection and a return to physical therapy then I would consider referral to orthopedics to discuss a subacromial decompression and rotator cuff debridement. In the meantime he can continue with activity as tolerated using pain as his guide but I have cautioned him about repetitive overhead movements.  Consent obtained and verified. Time-out conducted. Noted no overlying erythema, induration, or other signs of local infection. Skin prepped in a sterile fashion. Topical analgesic spray: Ethyl chloride. Joint: right shoulder (subacromial) Needle: 25g 1.5 inch Completed without difficulty. Meds: 1cc (40mg ) depomedrol, 3cc 1% xylocaine  Advised to call if fevers/chills, erythema, induration, drainage, or persistent bleeding.

## 2015-08-09 IMAGING — CR DG SHOULDER 2+V*R*
3 series · 3 of 3 positions shown · non-contrast
Comparison: None.

CLINICAL DATA: Chronic RIGHT shoulder pain.  No injury.

EXAM:
RIGHT SHOULDER - 2+ VIEW

[w shoulder ap internal righ]
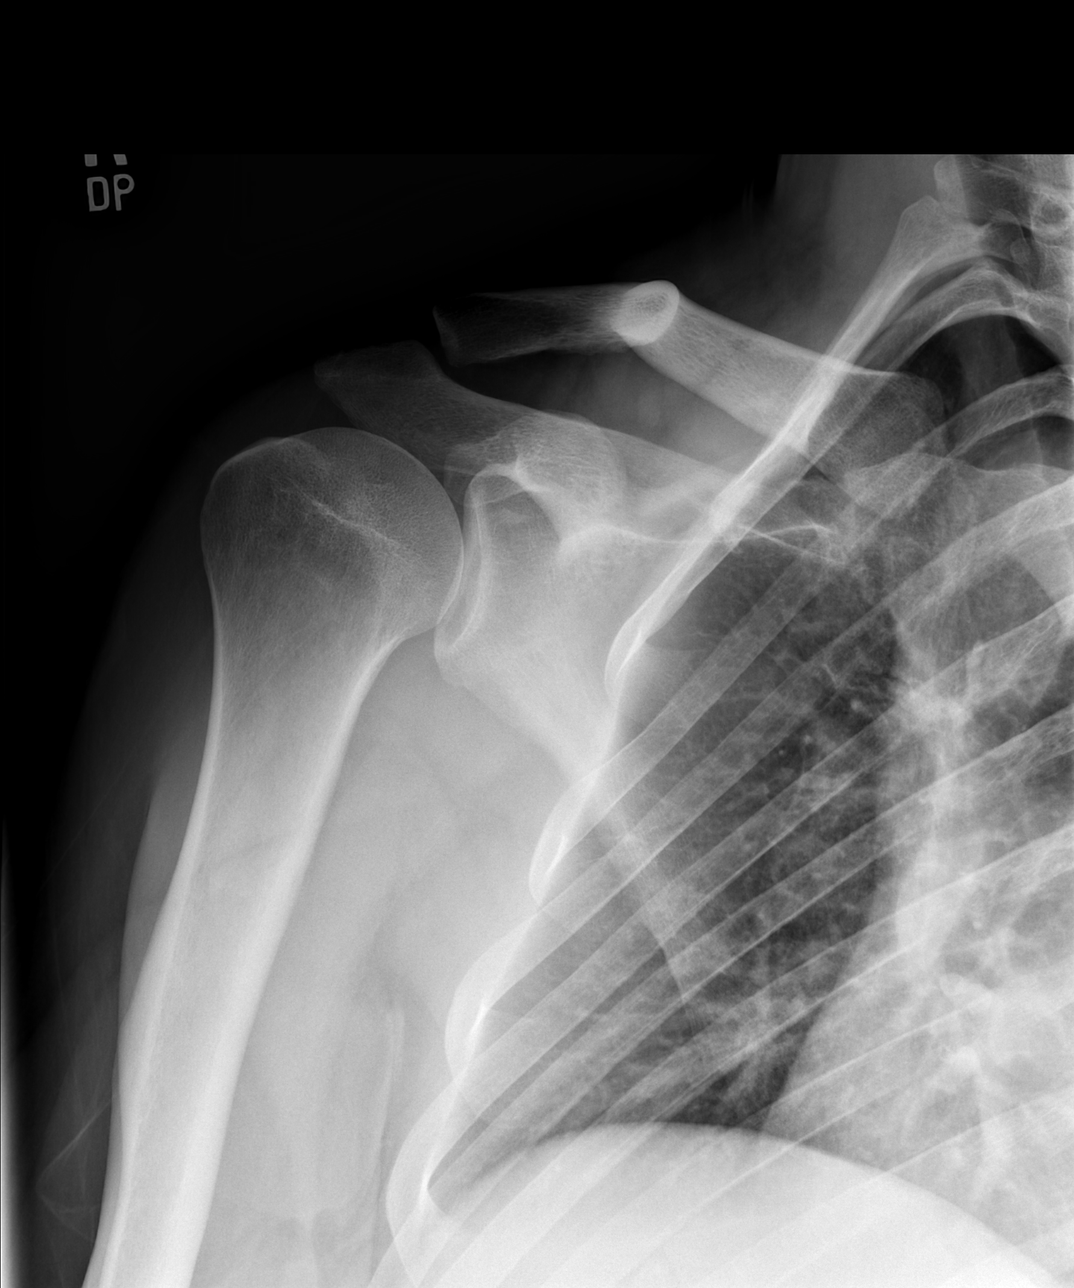

[w shoulder y view right]
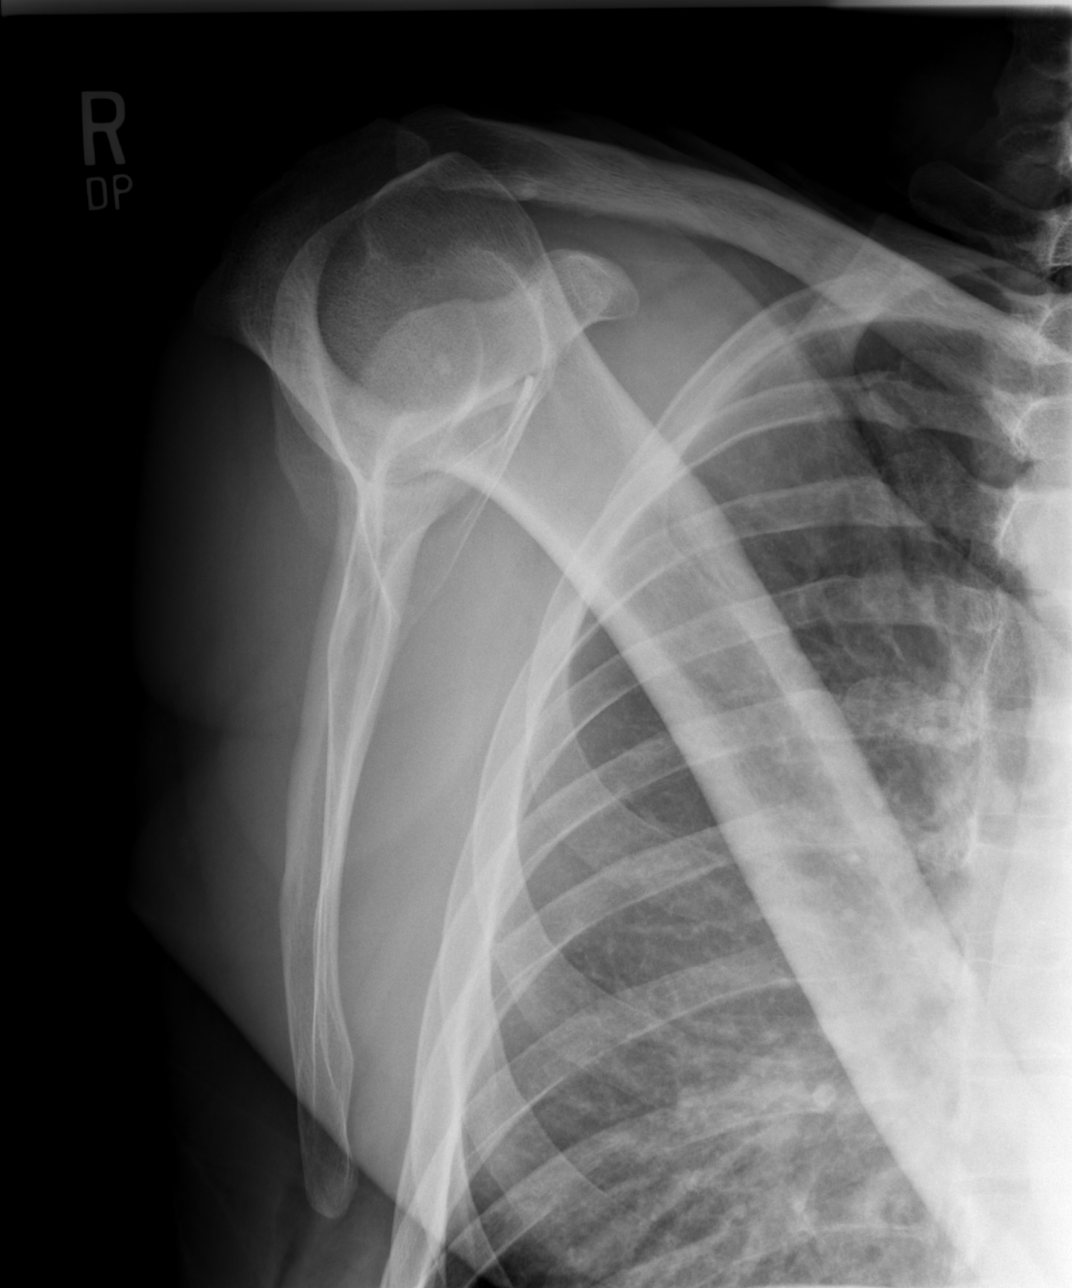

[w shoulder axillary right]
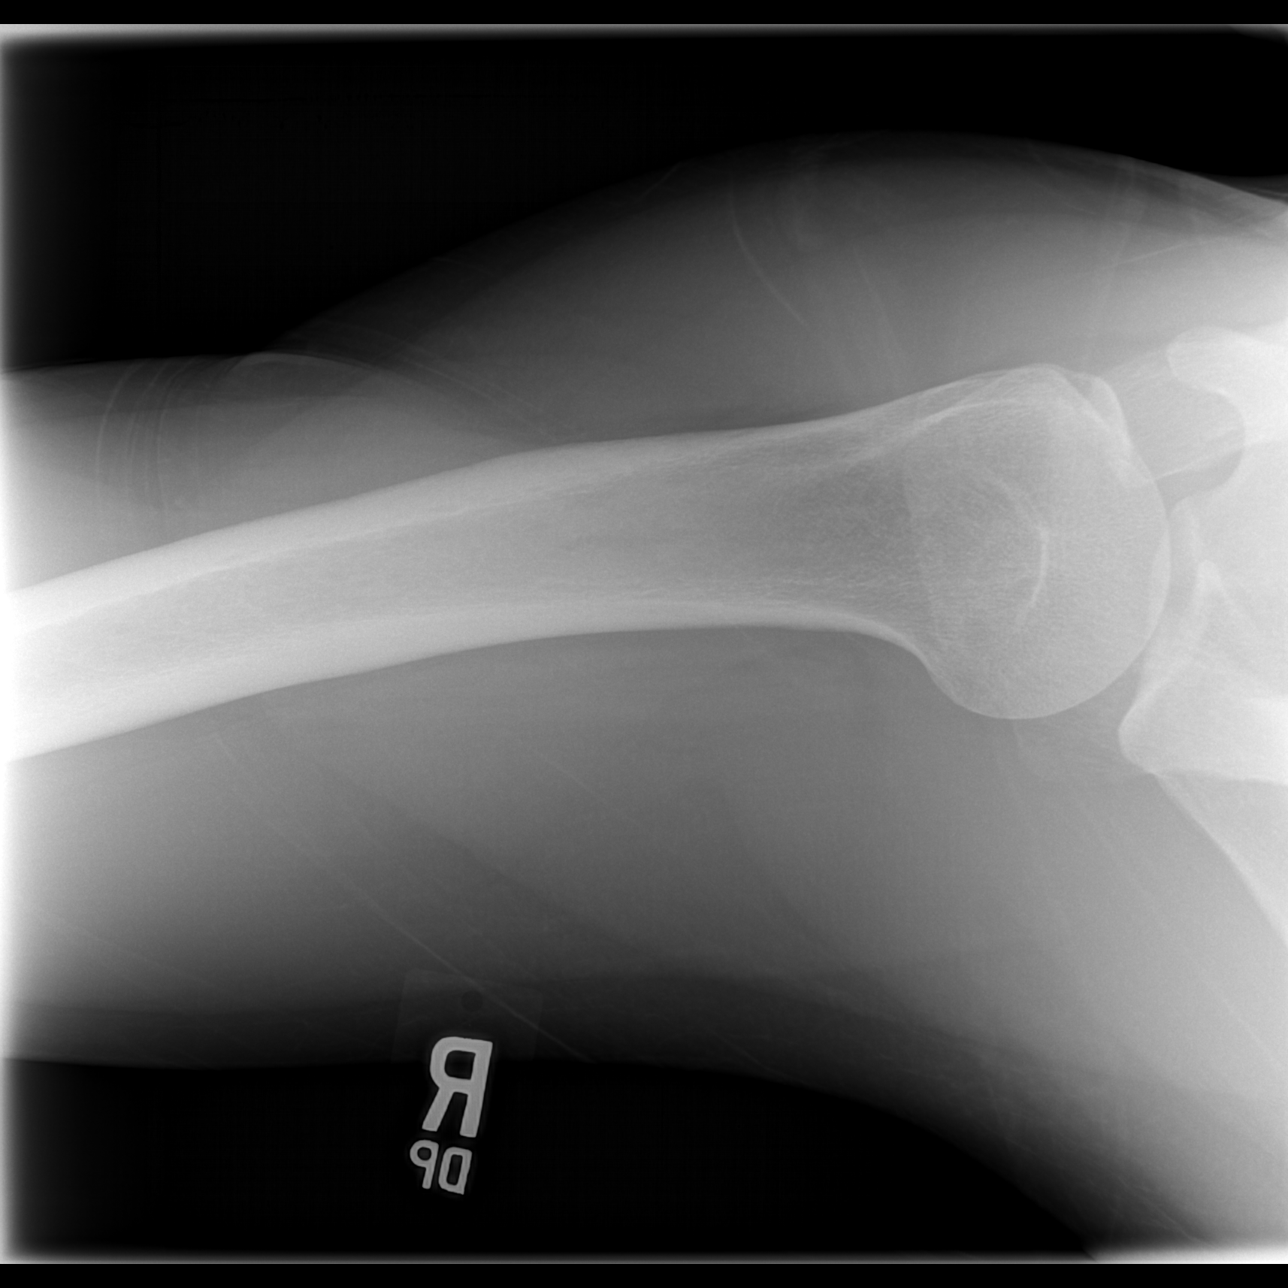

[3 of 3 positions shown; findings below may reference images not displayed]

FINDINGS: There is no evidence of fracture or dislocation. There is no
evidence of arthropathy or other focal bone abnormality. Soft
tissues are unremarkable.
IMPRESSION: Negative.

## 2015-11-10 ENCOUNTER — Encounter: Payer: Self-pay | Admitting: Sports Medicine

## 2015-11-10 ENCOUNTER — Ambulatory Visit (INDEPENDENT_AMBULATORY_CARE_PROVIDER_SITE_OTHER): Payer: BLUE CROSS/BLUE SHIELD | Admitting: Sports Medicine

## 2015-11-10 VITALS — BP 136/108 | Ht 73.0 in | Wt 200.0 lb

## 2015-11-10 DIAGNOSIS — M5442 Lumbago with sciatica, left side: Secondary | ICD-10-CM

## 2015-11-10 MED ORDER — PREDNISONE 10 MG PO TABS: ORAL_TABLET | ORAL | Status: AC

## 2015-11-10 NOTE — Patient Instructions (Signed)
MRI Locations:  [facility]: [phone] [facility]: [phone] [facility]: [phone] [facility]: [phone]

## 2015-11-10 NOTE — Progress Notes (Signed)
Subjective: CC: hip, lower back, and knee pain  HPI: Patient is a 35 y.o. male runner presenting for hip, lower back, and knee pain.   His left hip pain started first, knee pain started 8 months ago, and left lumbar pain occurred several months aog.  He stepped out of his car and felt a shooting pain from left hip down to the knee approximately 8 months ago- this happens intermittently since that time but he can't described any inciting factor.  No groin pain. No weakness.   Knee pain started next. No swelling of knee. Most pain is on the posterior/lateral aspect. Having issues with extension of the knees and touching his toes. He feels like his flexibility is decreased. No locking, catching, or popping.   Lumbar pain feels like a strain. Prolonged standing bothers his back, but he's still functional. No numbness/tingling. No saddle paresthesias.   He can still run and lift weights, he just feels "injured."   Additionally, he feels there is something in the left lateral neck (pointing to his trapezius) that snaps and is painful. The frequency varies.   Social History: teaches marching band  ROS: All other systems reviewed and are negative.  Past Medical History Patient Active Problem List   Diagnosis Date Noted  . Streptococcal sore throat 02/28/2014  . Conjunctivitis 11/18/2013  . Sinusitis 11/18/2013  . Routine general medical examination at a health care facility 04/16/2013  . RAD (reactive airway disease) 04/05/2012    Medications- reviewed and updated Current Outpatient Prescriptions  Medication Sig Dispense Refill  . amoxicillin (AMOXIL) 875 MG tablet Take 1 tablet (875 mg total) by mouth 2 (two) times daily. 20 tablet 0  . meloxicam (MOBIC) 15 MG tablet     . Multiple Vitamin (MULTIVITAMIN) tablet Take 1 tablet by mouth daily.     No current facility-administered medications for this visit.    Objective: Office vital signs reviewed. BP 136/108 mmHg  Ht 6\' 1"   (1.854 m)  Wt 200 lb (90.719 kg)  BMI 26.39 kg/m2   Physical Examination:  General: Awake, alert, well- nourished, NAD  Back: normal to inspection. Tenderness over the L lumbar region. Full ROM, relief with back extension, flexion with some reported pain. Side bends and rotation intact/equal.   Sensation equal bilaterally. Negative SLR  Left leg: No calf atrophy. Normal to inspection. Knee is normal to inspection without erythema, ecchymoses, effusion or obvious bony abnormalities.  No obvious Baker's cysts Palpation normal with no warmth or joint line tenderness or patellar tenderness or condyle tenderness.  No TTP along infrapatellar or pes anserine bursas.  ROM normal in flexion (135 degrees) and extension (0 degrees) and lower leg rotation. Ligaments with solid consistent endpoints including ACL, PCL, LCL, MCL. Negative Mcmurray's and provocative meniscal tests including Thessaly. Non painful patellar compression.  Normal Patellar glide.  No apprehension Patellar and quadriceps tendons unremarkable. Hamstring and quadriceps strength is normal.  Tenderness over the proximal IT band. Full ROM in the hip with no groin pain.  Negative FABERs. 4+/5 plantar flexion on L, 5/5 on the R. 3+ patellar DTRs, 2+ Achilles reflex on the R, 1+ on the left.   Assessment/Plan:  Left lumbar, hip, and knee pain: I suspect this is all connected. Would like to rule out nerve impingement given decreased Achilles reflex and decreased plantar flexion on the right. X-rays now. Given a list of imaging places and a Rx for MRI of lumbar spine. Will re-start physical therapy. Steroid taper. Patient  to follow up in 3-4 weeks.  Joanna Puff PGY-2, [facility]  Patient seen and evaluated with the resident. I agree with the above plan of care. Patient's diagnosis is not straightforward. I suspect some of his symptomatology is arising from his lumbar spine. His diminished Achilles reflex on the left would also  suggest that. Therefore, I'm going to start with getting an x-ray of his lumbar spine. I will also send him for physical therapy and he will start a 6 day Sterapred Dosepak. He will follow-up with me in 3-4 weeks. I did explain to him that we should consider an MRI of his lumbar spine sometime in the near future not only because of his current pain but also because of his history of long-standing intermittent low back pain that has not been fully evaluated.

## 2015-12-09 ENCOUNTER — Encounter: Payer: Self-pay | Admitting: Sports Medicine

## 2015-12-09 ENCOUNTER — Ambulatory Visit (INDEPENDENT_AMBULATORY_CARE_PROVIDER_SITE_OTHER): Payer: BLUE CROSS/BLUE SHIELD | Admitting: Sports Medicine

## 2015-12-09 VITALS — BP 148/72 | Ht 73.0 in | Wt 200.0 lb

## 2015-12-09 DIAGNOSIS — M5442 Lumbago with sciatica, left side: Secondary | ICD-10-CM | POA: Diagnosis not present

## 2015-12-09 MED ORDER — PREDNISONE 10 MG (21) PO TBPK: ORAL_TABLET | ORAL | Status: AC

## 2015-12-09 NOTE — Progress Notes (Signed)
Patient ID: Carlos Hardy, male   DOB: 22-Jan-1981, 35 y.o.   MRN: [id number]  CC: F/u L low back, hip, and knee pain  HPI: Carlos Hardy is a 35 y.o. yo male with noncontributory PMH presenting for f/u L low back, L hip, and L posterior knee pain.  Was seen for May 2017 for these issues, given steroid pack and PT.  Pt states he feels much better today with regards to all of these, attributes this mostly to PT.  Steroids did not help.  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   L Knee:  First had pain in the back of his knee multiple months ago, was limiting his ability to straighten out his leg, felt like "tightness" by his hamstrings.  Today notes complete resolution of knee pain.  Does not bother him even with straightening out leg, does not limit his activity.  L Hip: Developed L hip pain about 9 months ago with a certain stepping motion while getting out of his car.  Original pain was shooting all down his leg, concentrated around outside edge of hip (points posteromedial to greater trochanter).  This pain has gotten significantly better with PT but is not yet gone.  Certain motions exacerbate it, as does running, but still able to exercise.    L low back: Also has had on and off low back pain since "high school," but L low back for the past year had been worse.  When seen in May was having tightness in the area.  This has also completely resolved.  No numbness/pain/tingling shooting down legs.  No fecal/urinary incontinence, saddle anesthesia, pain waking him up out of sleep, or unexplained weight loss.   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 yo male sitting one exam table in  NAD HEENT: normocephalic, atraumatic Pulm: normal work of breathing MSK:  Back: normal degree of thoracic kyphosis and lumbar lordosis, non-TTP over spinous processes and paraspinal muscles throughout, full active ROM, - straight leg raises bilaterally LLE: Knee and hip with normal bony/articular architecture on inspection, no overlying skin changes, non-TTP throughout, full active ROM, knee extension/flexion and hip abductor strength 5/5 and equal bilaterally  Neuro: knee extension (L4), great toe extension (L5), and ankle plantarflexion (S1) 5/5 strength, Achilles and patellar reflex 2+ and symmetrical Gait: normal, unhindered  A/P: Carlos Hardy is a 35 y.o. yo male presenting for f/u L low back, hip, and knee pain that has improved greatly.  L back, hip, knee pain Believed to all stem from common etiology, likely poor posturing. -- Much improved with two sessions of PT, has one session left -- Advised to go to last PT session -- Continue good posturing and recommended exercises at home to help prevent future problems -- No other intervention needed  Follow-up: PRN  Francie Massingominick Lorrena Goranson, MS4   Patient seen and evaluated with the medical student. I agree with the above plan of care. Patient is doing much better with physical therapy. He will continue to work with Ellamae SiaJohn O Halloran until discharge. Follow-up with me as needed.

## 2019-08-26 ENCOUNTER — Ambulatory Visit: Payer: 59 | Attending: Internal Medicine

## 2019-08-26 DIAGNOSIS — Z23 Encounter for immunization: Secondary | ICD-10-CM | POA: Insufficient documentation

## 2019-08-26 NOTE — Progress Notes (Signed)
   Covid-19 Vaccination Clinic  Name:  Paulo Keimig    MRN: [id number] DOB: 1981/02/08  08/26/2019  Mr. Mccrae was observed post Covid-19 immunization for 15 minutes without incident. He was provided with Vaccine Information Sheet and instruction to access the V-Safe system.   Mr. Dittus was instructed to call 911 with any severe reactions post vaccine: Marland Kitchen Difficulty breathing  . Swelling of face and throat  . A fast heartbeat  . A bad rash all over body  . Dizziness and weakness   Immunizations Administered    Name Date Dose VIS Date Route   Pfizer COVID-19 Vaccine 08/26/2019  3:23 PM 0.3 mL 06/01/2019 Intramuscular   Manufacturer: [employer], [employer]   Lot: [id number]   NDC: [address]-4

## 2019-09-26 ENCOUNTER — Ambulatory Visit: Payer: 59 | Attending: Internal Medicine

## 2019-09-26 DIAGNOSIS — Z23 Encounter for immunization: Secondary | ICD-10-CM

## 2019-09-26 NOTE — Progress Notes (Signed)
   Covid-19 Vaccination Clinic  Name:  Hance Caspers    MRN: [id number] DOB: Nov 07, 1980  09/26/2019  Mr. Debruyne was observed post Covid-19 immunization for 15 minutes without incident. He was provided with Vaccine Information Sheet and instruction to access the V-Safe system.   Mr. Hussar was instructed to call 911 with any severe reactions post vaccine: Marland Kitchen Difficulty breathing  . Swelling of face and throat  . A fast heartbeat  . A bad rash all over body  . Dizziness and weakness   Immunizations Administered    Name Date Dose VIS Date Route   Pfizer COVID-19 Vaccine 09/26/2019  2:03 PM 0.3 mL 06/01/2019 Intramuscular   Manufacturer: [employer], [employer]   Lot: [id number]   NDC: [address]-4
